# Patient Record
Sex: Female | Born: 1995 | Race: White | Hispanic: No | Marital: Single | Smoking: Current every day smoker
Health system: Southern US, Community
[De-identification: ages and names within clinical notes are randomized; demographics above are authoritative.]

## PROBLEM LIST (undated history)

## (undated) ENCOUNTER — Inpatient Hospital Stay (HOSPITAL_COMMUNITY): Payer: Self-pay

## (undated) DIAGNOSIS — J40 Bronchitis, not specified as acute or chronic: Secondary | ICD-10-CM

## (undated) DIAGNOSIS — F39 Unspecified mood [affective] disorder: Secondary | ICD-10-CM

## (undated) HISTORY — PX: TONSILLECTOMY: SUR1361

## (undated) HISTORY — PX: NASAL SINUS SURGERY: SHX719

## (undated) HISTORY — PX: WISDOM TOOTH EXTRACTION: SHX21

## (undated) HISTORY — PX: OTHER SURGICAL HISTORY: SHX169

---
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
## 2014-09-20 IMAGING — CT CT MAXILLOFACIAL W/O CM
2 series · 15 of 40 positions shown, 18 images · non-contrast
Comparison: None.

CLINICAL DATA: Fusion protocol for sinus surgery.  Chronic
sinusitis.

CT MAXILLOFACIAL WITHOUT CONTRAST
TECHNIQUE: Multidetector CT imaging of the maxillofacial
structures was performed. Multiplanar CT image reconstructions were
also generated.

[Series 400: cor · coronal · 0.31mm/px · 12 of 62 slices shown, 15 images]
[im 5/62  brain]
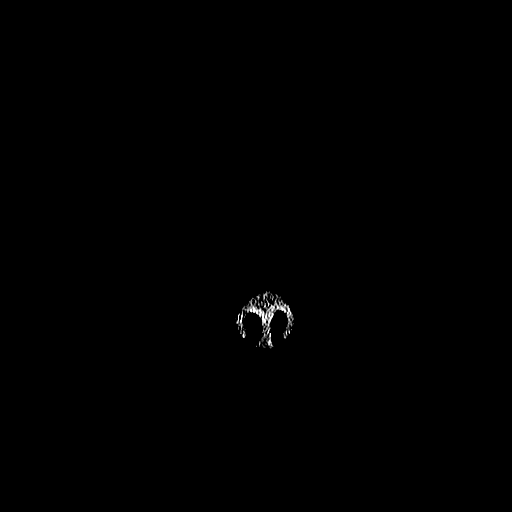
[im 5/62  bone]
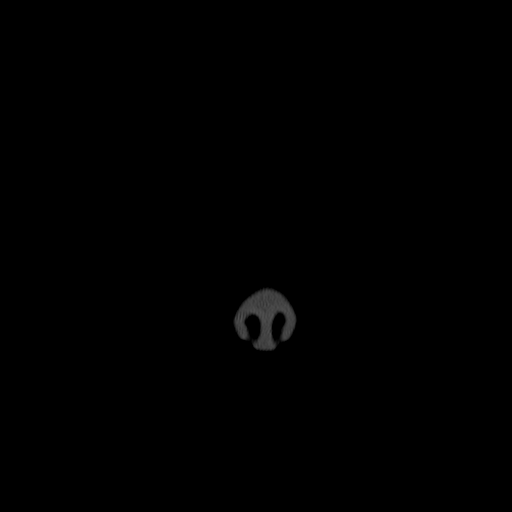
[im 9/62  bone]
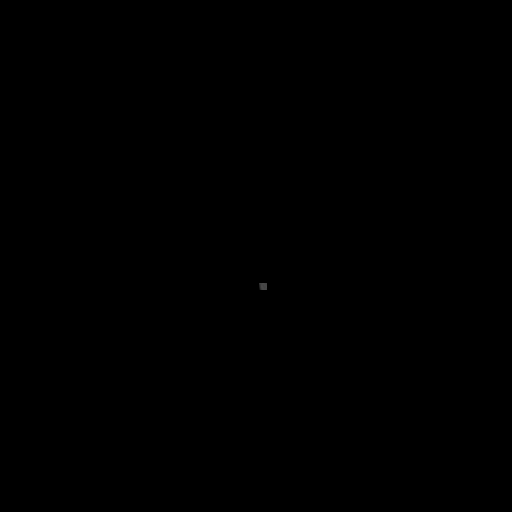
[im 14/62  bone]
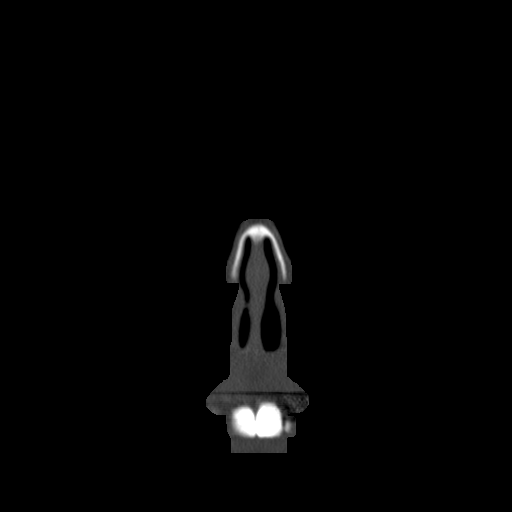
[im 21/62  bone]
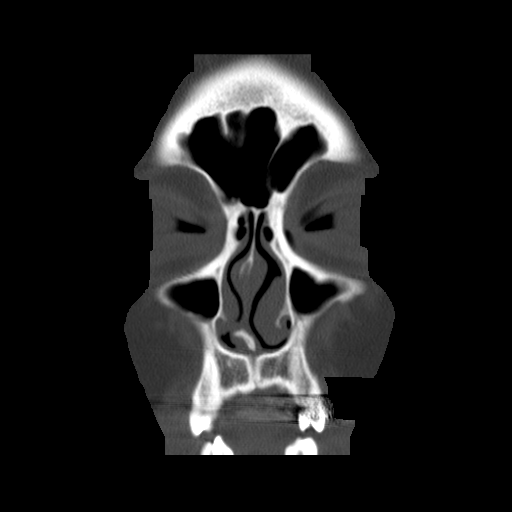
[im 27/62  brain]
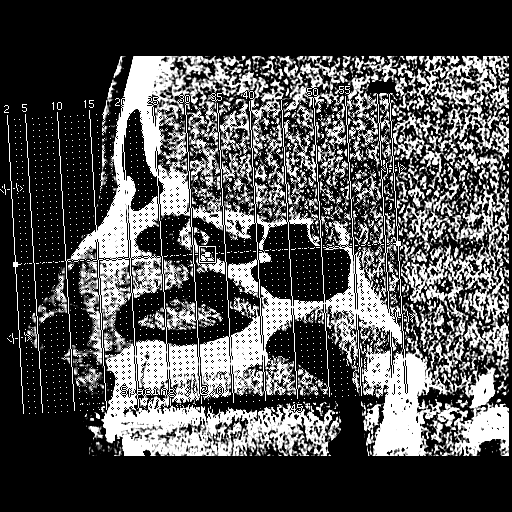
[im 27/62  bone]
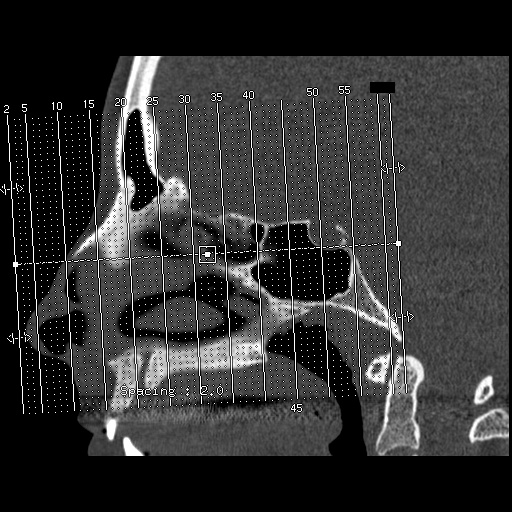
[im 28/62  bone]
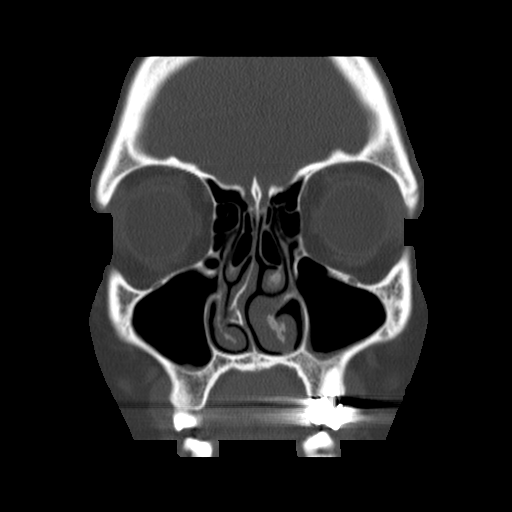
[im 34/62  bone]
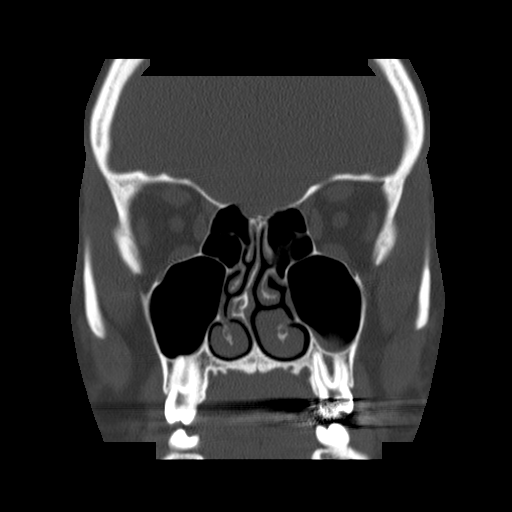
[im 35/62  bone]
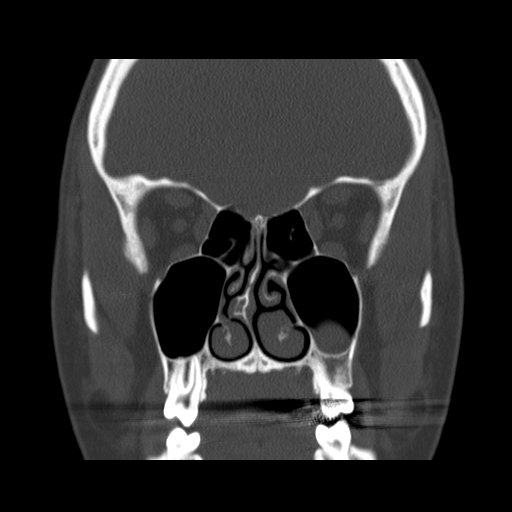
[im 41/62  brain]
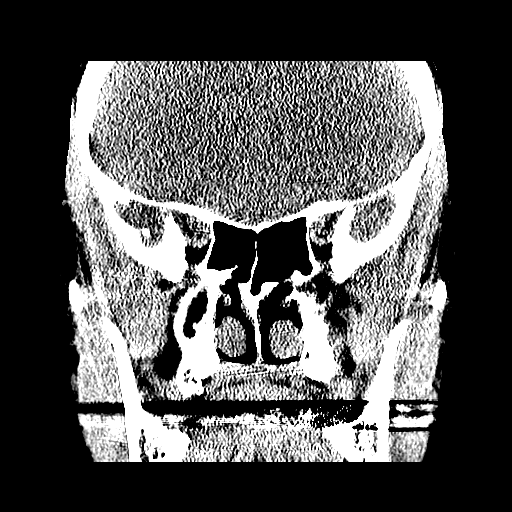
[im 41/62  bone]
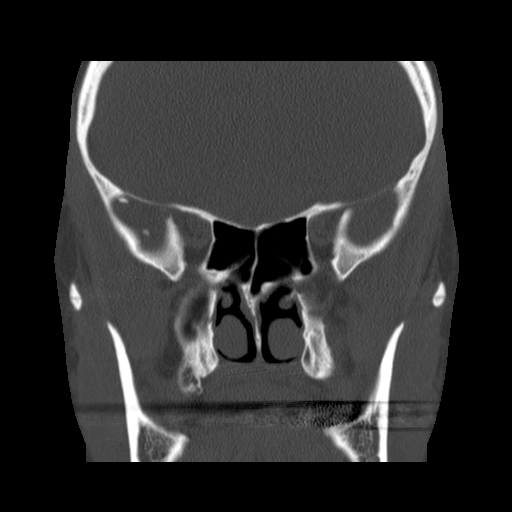
[im 48/62  bone]
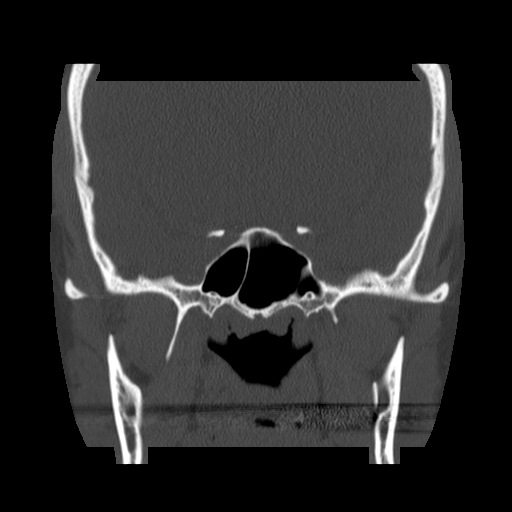
[im 53/62  bone]
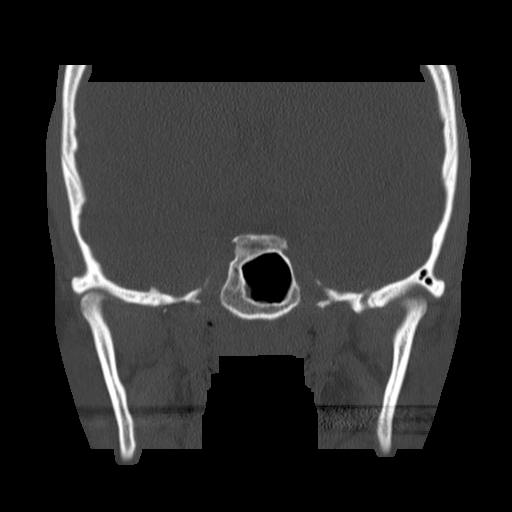
[im 57/62  bone]
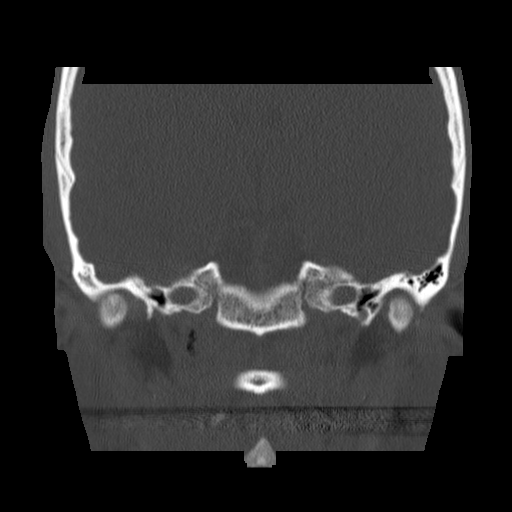

[Series 401: sag · sagittal · 0.31mm/px · 3 of 62 slices shown]
[im 27/62  bone]
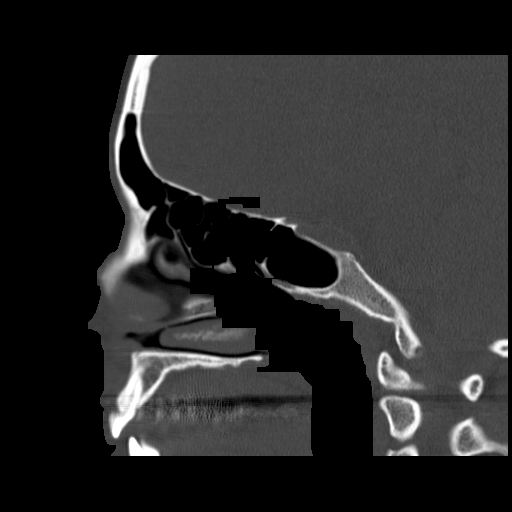
[im 31/62  bone]
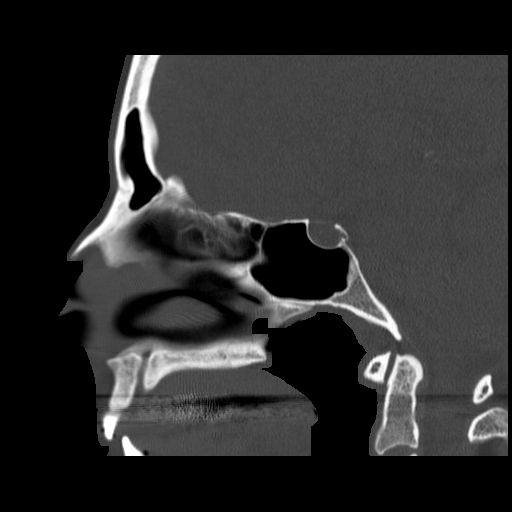
[im 35/62  bone]
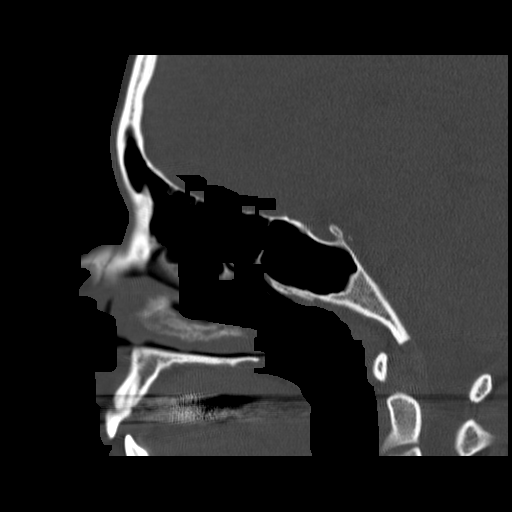

[15 of 40 positions shown; findings below may reference images not displayed]

FINDINGS: Fusion protocol was performed for operative planning.

The frontal, ethmoid, sphenoid and right maxillary sinuses are
entirely clear.  Left maxillary sinus shows mucosal thickening
along the floor and posterior wall with a 14 mm posterior wall
retention cyst.  Previous route canal evident tooth number 14

There is pronounced nasal septal deviation towards the right with a
rightward projecting spur.  Nasal passages on the right are
considerably narrowed.  Ostiomeatal complex on the left is patent,
with a single Haller cell adjacent.  Ostiomeatal complex on the
right is very thin.  Single Haller cell on this side as well.
IMPRESSION: Mucosal thickening along the floor over and posterior wall of the
left maxillary sinus with a 14 mm posterior left maxillary sinus
retention cyst.

Pronounced nasal septal deviation towards the right with a
rightward projecting septal spur.  Markedly thin ostiomeatal
complex on the right.  Single Haller cell on each side.

## 2017-04-25 ENCOUNTER — Ambulatory Visit: Payer: Medicaid Other | Admitting: Family Medicine

## 2017-12-25 ENCOUNTER — Encounter (HOSPITAL_COMMUNITY): Payer: Self-pay | Admitting: Emergency Medicine

## 2017-12-25 ENCOUNTER — Emergency Department (HOSPITAL_COMMUNITY): Payer: Medicaid Other

## 2017-12-25 ENCOUNTER — Emergency Department (HOSPITAL_COMMUNITY)
Admission: EM | Admit: 2017-12-25 | Discharge: 2017-12-25 | Disposition: A | Discharge status: Home / Self Care | Payer: Medicaid Other | Attending: Emergency Medicine | Admitting: Emergency Medicine

## 2017-12-25 DIAGNOSIS — O0289 Other abnormal products of conception: Secondary | ICD-10-CM

## 2017-12-25 DIAGNOSIS — F17210 Nicotine dependence, cigarettes, uncomplicated: Secondary | ICD-10-CM | POA: Diagnosis not present

## 2017-12-25 DIAGNOSIS — Z3A00 Weeks of gestation of pregnancy not specified: Secondary | ICD-10-CM | POA: Diagnosis not present

## 2017-12-25 DIAGNOSIS — O99330 Smoking (tobacco) complicating pregnancy, unspecified trimester: Secondary | ICD-10-CM | POA: Diagnosis not present

## 2017-12-25 DIAGNOSIS — Z79899 Other long term (current) drug therapy: Secondary | ICD-10-CM | POA: Diagnosis not present

## 2017-12-25 DIAGNOSIS — O209 Hemorrhage in early pregnancy, unspecified: Secondary | ICD-10-CM | POA: Diagnosis present

## 2017-12-25 DIAGNOSIS — N939 Abnormal uterine and vaginal bleeding, unspecified: Secondary | ICD-10-CM

## 2017-12-25 DIAGNOSIS — O3680X2 Pregnancy with inconclusive fetal viability, fetus 2: Secondary | ICD-10-CM | POA: Diagnosis not present

## 2017-12-25 LAB — BASIC METABOLIC PANEL
ANION GAP: 14 (ref 5–15)
BUN: 12 mg/dL (ref 6–20)
CALCIUM: 9.2 mg/dL (ref 8.9–10.3)
CO2: 20 mmol/L — AB (ref 22–32)
Chloride: 104 mmol/L (ref 101–111)
Creatinine, Ser: 0.57 mg/dL (ref 0.44–1.00)
GFR calc non Af Amer: >60 mL/min (ref >60)
Glucose, Bld: 91 mg/dL (ref 65–99)
Potassium: 4.5 mmol/L (ref 3.5–5.1)
Sodium: 138 mmol/L (ref 135–145)

## 2017-12-25 LAB — WET PREP, GENITAL
Sperm: NONE SEEN
Trich, Wet Prep: NONE SEEN
YEAST WET PREP: NONE SEEN

## 2017-12-25 LAB — URINALYSIS, ROUTINE W REFLEX MICROSCOPIC
Bilirubin Urine: NEGATIVE
Glucose, UA: NEGATIVE mg/dL
KETONES UR: 20 mg/dL — AB
Leukocytes, UA: NEGATIVE
Nitrite: NEGATIVE
PH: 6 (ref 5.0–8.0)
Protein, ur: NEGATIVE mg/dL
SPECIFIC GRAVITY, URINE: 1.011 (ref 1.005–1.030)

## 2017-12-25 LAB — CBC WITH DIFFERENTIAL/PLATELET
BASOS PCT: 0 %
Basophils Absolute: 0 10*3/uL (ref 0.0–0.1)
Eosinophils Absolute: 0.1 10*3/uL (ref 0.0–0.7)
Eosinophils Relative: 1 %
HEMATOCRIT: 43.2 % (ref 36.0–46.0)
HEMOGLOBIN: 14.9 g/dL (ref 12.0–15.0)
Lymphocytes Relative: 33 %
Lymphs Abs: 2.7 10*3/uL (ref 0.7–4.0)
MCH: 30.5 pg (ref 26.0–34.0)
MCHC: 34.5 g/dL (ref 30.0–36.0)
MCV: 88.5 fL (ref 78.0–100.0)
MONOS PCT: 7 %
Monocytes Absolute: 0.6 10*3/uL (ref 0.1–1.0)
NEUTROS PCT: 59 %
Neutro Abs: 4.8 10*3/uL (ref 1.7–7.7)
Platelets: 327 10*3/uL (ref 150–400)
RBC: 4.88 MIL/uL (ref 3.87–5.11)
RDW: 12.9 % (ref 11.5–15.5)
WBC: 8.2 10*3/uL (ref 4.0–10.5)

## 2017-12-25 LAB — HCG, QUANTITATIVE, PREGNANCY: HCG, BETA CHAIN, QUANT, S: 11659 m[IU]/mL — AB (ref <5)

## 2017-12-25 NOTE — ED Notes (Signed)
Patient has came back to the room. She reports she had to leave and smoke a cigarette. Notified provider that she has returned and wanting test results.

## 2017-12-25 NOTE — ED Notes (Signed)
Provider at bedside with patient.

## 2017-12-25 NOTE — ED Notes (Signed)
Patient has been updated that she is waiting on test results and provider to re-evaluate. Patient was frustrated and wanting to leave due to long emergency department visit.

## 2017-12-25 NOTE — ED Notes (Signed)
This Clinical research associatewriter called lab to check on the status of the HCG quantitative test. Was told by the lab tech that there was a problem with the machine and that the results will be available in 10 min.

## 2017-12-25 NOTE — ED Triage Notes (Signed)
Pt reports that she just found out she was pregnant and has had vaginal bleeding since last week. Reports was lighter and now gotten darker and more heavy. Reports having to change menstrual supplies about 10-15 minutes.

## 2017-12-25 NOTE — ED Notes (Signed)
Patient came to nursing station crying, asking to leave. Patient reports she wants to go outside and smoke and eat dinner. Patient reports shes tired of waiting. EDPA notified. Patient updated that she is waiting for ultrasound. Patient opted to stay and wait for her ultrasound.

## 2017-12-25 NOTE — ED Provider Notes (Signed)
[facility] DEPT Provider Note   CSN: [id number] Arrival date & time: 12/25/17  1051  History   Chief Complaint Chief Complaint  Patient presents with  . Vaginal Bleeding    HPI Shirley Escobar is a 22 y.o. female is here for evaluation of vaginal bleeding that began on 4/14.  Initial 4-5 days of bleeding was mild and bright red however over the last 3 days bleeding has become heavier and darker red looking more like a menstrual flow. Bleeding has been constant, with small clots. She is wearing panty liners and changing them every 10-15 min. She has no abdominal pain or cramping, dysuria, frequency, urgency, abnormal vaginal discharge, nausea, vomiting, changes in bowel movements. Thinks she maybe 2-3 months pregnant. Her last menstrual period was approximately 2 months ago. Her menstrual cycles tend to be irregular, frequently 5 days late. She is sexually active with 1 female partner without condom use. She does not use any other birth control methods. She has taken multiple urine pregnancy tests. Never been pregnant before.   HPI  Past Medical History:  Diagnosis Date  . Bronchitis   . Mood disorder ([place])     There are no active problems to display for this patient.   Past Surgical History:  Procedure Laterality Date  . NASAL SINUS SURGERY    . removal of birth mark    . TONSILLECTOMY    . WISDOM TOOTH EXTRACTION       OB History    Gravida  1   Para  0   Term  0   Preterm  0   AB  0   Living  0     SAB  0   TAB  0   Ectopic  0   Multiple  0   Live Births               Home Medications    Prior to Admission medications   Medication Sig Start Date End Date Taking? Authorizing Provider  Prenatal Vit-Fe Fumarate-FA (PRENATAL PO) Take 1 tablet by mouth daily.   Yes [provider]  albuterol (PROVENTIL HFA;VENTOLIN HFA) 108 (90 BASE) MCG/ACT inhaler Inhale 2 puffs into the lungs 4 (four) times daily. Patient  not taking: Reported on 12/25/2017 02/02/13   Harden Mo, MD  amoxicillin-clavulanate (AUGMENTIN) 875-125 MG per tablet Take 1 tablet by mouth 2 (two) times daily. Patient not taking: Reported on 12/25/2017 02/02/13   Harden Mo, MD  naproxen (NAPROSYN) 500 MG tablet Take 1 tablet (500 mg total) by mouth 2 (two) times daily with a meal. Patient not taking: Reported on 12/25/2017 01/30/13   Erby Pian, FNP  Norethindrone Acetate-Ethinyl Estradiol (JUNEL 1.5/30) 1.5-30 MG-MCG tablet Take 1 tablet by mouth daily. Patient not taking: Reported on 12/25/2017 01/30/13   Erby Pian, FNP  predniSONE (DELTASONE) 20 MG tablet Take 1 tablet (20 mg total) by mouth 2 (two) times daily. Patient not taking: Reported on 12/25/2017 02/02/13   Harden Mo, MD  traMADol (ULTRAM) 50 MG tablet Take 2 tablets (100 mg total) by mouth every 8 (eight) hours as needed for pain. Patient not taking: Reported on 12/25/2017 02/02/13   Harden Mo, MD    Family History Family History  Problem Relation Age of Onset  . Bipolar disorder Mother     Social History Social History   Tobacco Use  . Smoking status: Current Every Day Smoker    Packs/day: 1.00  .  Smokeless tobacco: Never Used  Substance Use Topics  . Alcohol use: No  . Drug use: No     Allergies   Patient has no known allergies.   Review of Systems Review of Systems  Genitourinary: Positive for vaginal bleeding.  All other systems reviewed and are negative.    Physical Exam Updated Vital Signs BP 129/81 (BP Location: Right Arm)   Pulse 88   Temp 98.6 F (37 C) (Oral)   Resp 12   Ht 5' (1.524 m)   Wt 54.4 kg (120 lb)   LMP 10/24/2017   SpO2 100%   BMI 23.44 kg/m   Physical Exam  Constitutional: She is oriented to person, place, and time. She appears well-developed and well-nourished. No distress.  Non toxic  HENT:  Head: Normocephalic and atraumatic.  Nose: Nose normal.  Mouth/Throat: No oropharyngeal exudate.    Moist mucous membranes   Eyes: Pupils are equal, round, and reactive to light. Conjunctivae and EOM are normal.  Neck: Normal range of motion.  Cardiovascular: Normal rate, regular rhythm and intact distal pulses.  No murmur heard. 2+ DP and radial pulses bilaterally. No LE edema.   Pulmonary/Chest: Effort normal and breath sounds normal. No respiratory distress. She has no wheezes. She has no rales.  Abdominal: Soft. Bowel sounds are normal. There is no tenderness.  No G/R/R. No suprapubic or CVA tenderness. No obvious abdominal fullness  Genitourinary: There is bleeding in the vagina.  Genitourinary Comments: External genitalia normal without erythema, edema, tenderness, discharge or lesions.  No groin lymphadenopathy.  Vaginal mucosa and cervix without discharge or lesions or obvious source of bleed. Cervical os closed. Scant amount of dark blood in vaginal vault and over cervix. No clots or tissue noted.  Uterus in midline, smooth, not enlarged or tender. No CMT. Non palpable adnexa.  Musculoskeletal: Normal range of motion. She exhibits no deformity.  Neurological: She is alert and oriented to person, place, and time.  Skin: Skin is warm and dry. Capillary refill takes less than 2 seconds.  Psychiatric: She has a normal mood and affect. Her behavior is normal. Judgment and thought content normal.  Nursing note and vitals reviewed.    ED Treatments / Results  Labs (all labs ordered are listed, but only abnormal results are displayed) Labs Reviewed  WET PREP, GENITAL - Abnormal; Notable for the following components:      Result Value   Clue Cells Wet Prep HPF POC PRESENT (*)    WBC, Wet Prep HPF POC MANY (*)    All other components within normal limits  HCG, QUANTITATIVE, PREGNANCY - Abnormal; Notable for the following components:   hCG, Beta Chain, Quant, S 11,659 (*)    All other components within normal limits  URINALYSIS, ROUTINE W REFLEX MICROSCOPIC - Abnormal; Notable for  the following components:   Hgb urine dipstick LARGE (*)    Ketones, ur 20 (*)    Bacteria, UA RARE (*)    Squamous Epithelial / LPF 6-30 (*)    All other components within normal limits  BASIC METABOLIC PANEL - Abnormal; Notable for the following components:   CO2 20 (*)    All other components within normal limits  CBC WITH DIFFERENTIAL/PLATELET  TYPE AND SCREEN  RH IG WORKUP (INCLUDES ABO/RH)  GC/CHLAMYDIA PROBE AMP ([place]) NOT AT [facility]    EKG None  Radiology US Ob Comp < 14 Wks  Result Date: 12/25/2017 CLINICAL DATA:  Vaginal bleeding x3 days.  Beta HCG  11,659 EXAM: TWIN OBSTETRIC <14WK [place] AND TRANSVAGINAL OB US COMPARISON:  None. FINDINGS: Number of IUPs:  2 Chorionicity/Amnionicity:  Dichorionic-diamniotic (thick membrane) TWIN 1 Yolk sac:  Not Visualized. Embryo:  Visualized. Cardiac Activity: Not Visualized. Heart Rate: 0 bpm CRL:  11 mm   7 w 2 d                  [place] EDC: 08/11/2018 TWIN 2 Yolk sac:  Not Visualized. Embryo:  Visualized. Cardiac Activity: Not Visualized. Heart Rate: 0 bpm CRL:  10 mm   7 w 1 d                  [place] EDC: 08/12/2018 Subchorionic hemorrhage:  None visualized. Maternal uterus/adnexae: Unremarkable adnexa. IMPRESSION: Criteria met for nonviable twin intrauterine pregnancy with crown lump length greater than 7 mm each and no heart rate detected within either gestation. This follows SRU consensus guidelines: Diagnostic Criteria for Nonviable Pregnancy Early in the First Trimester. Alison Stalling J Med [phone]. Electronically Signed   By: Ashley Royalty M.D.   On: 12/25/2017 19:45   US Ob Comp Addl Gest Less 14 Wks  Result Date: 12/25/2017 CLINICAL DATA:  Vaginal bleeding x3 days.  Beta HCG 11,659 EXAM: TWIN OBSTETRIC <14WK [place] AND TRANSVAGINAL OB US COMPARISON:  None. FINDINGS: Number of IUPs:  2 Chorionicity/Amnionicity:  Dichorionic-diamniotic (thick membrane) TWIN 1 Yolk sac:  Not Visualized. Embryo:  Visualized. Cardiac Activity: Not Visualized. Heart  Rate: 0 bpm CRL:  11 mm   7 w 2 d                  [place] EDC: 08/11/2018 TWIN 2 Yolk sac:  Not Visualized. Embryo:  Visualized. Cardiac Activity: Not Visualized. Heart Rate: 0 bpm CRL:  10 mm   7 w 1 d                  [place] EDC: 08/12/2018 Subchorionic hemorrhage:  None visualized. Maternal uterus/adnexae: Unremarkable adnexa. IMPRESSION: Criteria met for nonviable twin intrauterine pregnancy with crown lump length greater than 7 mm each and no heart rate detected within either gestation. This follows SRU consensus guidelines: Diagnostic Criteria for Nonviable Pregnancy Early in the First Trimester. Alison Stalling J Med [phone]. Electronically Signed   By: Ashley Royalty M.D.   On: 12/25/2017 19:45   US Ob Transvaginal  Result Date: 12/25/2017 CLINICAL DATA:  Vaginal bleeding x3 days.  Beta HCG 11,659 EXAM: TWIN OBSTETRIC <14WK [place] AND TRANSVAGINAL OB US COMPARISON:  None. FINDINGS: Number of IUPs:  2 Chorionicity/Amnionicity:  Dichorionic-diamniotic (thick membrane) TWIN 1 Yolk sac:  Not Visualized. Embryo:  Visualized. Cardiac Activity: Not Visualized. Heart Rate: 0 bpm CRL:  11 mm   7 w 2 d                  [place] EDC: 08/11/2018 TWIN 2 Yolk sac:  Not Visualized. Embryo:  Visualized. Cardiac Activity: Not Visualized. Heart Rate: 0 bpm CRL:  10 mm   7 w 1 d                  [place] EDC: 08/12/2018 Subchorionic hemorrhage:  None visualized. Maternal uterus/adnexae: Unremarkable adnexa. IMPRESSION: Criteria met for nonviable twin intrauterine pregnancy with crown lump length greater than 7 mm each and no heart rate detected within either gestation. This follows SRU consensus guidelines: Diagnostic Criteria for Nonviable Pregnancy Early in the First Trimester. Alison Stalling J Med [phone]. Electronically Signed   By: Shanon Brow  Randel Pigg M.D.   On: 12/25/2017 19:45    Procedures Procedures (including critical care time)  Medications Ordered in ED Medications - No data to display   Initial Impression / Assessment and Plan / ED  Course  I have reviewed the triage vital signs and the nursing notes.  Pertinent labs & imaging results that were available during my care of the patient were reviewed by me and considered in my medical decision making (see chart for details).  Clinical Course as of Dec 25 2100  Mon Dec 25, 2017  1658 HCG, Beta Neomia Dear(!): 69,409 [CG]  1658 Hgb urine dipstick(!): LARGE [CG]  1955 RN notified me patient not in room. Will attempt to locate.    [CG]    Clinical Course User Index [CG] Kinnie Feil, PA-C    22 year old here with painless vaginal bleeding 7 days. Positive urine pregnancy test at home. Differential diagnosis includes first trimester abortion, ectopic. Pending hCG Quant screening labs. We'll plan on doing pelvic exam.  Exam as above with closed cervical os, blood in vaginal vaults but not CMT, uterine or adnexal tenderness. She has been HD stable. No abdominal pain or cramping.    Labs and imaging reviewed. hcg 11, 659. Hcg WNL. UA without convincing signs of infection. Unfortunately, US shows non viable twin pregnant w/o detectable FHR.  Discussed lab and ultrasound findings with patient. I spoke to Dr. Rosana Hoes (OBGYN) who recommends close f/u in 24 hours for misoprostol vs D&C, pt states she will go to appointment.  Pending T&S/Rh prior to dc. Discussed return precautions.  Final Clinical Impressions(s) / ED Diagnoses    Final diagnoses:  Vaginal bleeding  Nonviable pregnancy    ED Discharge Orders    None       Arlean Hopping 12/25/17 2102    Margette Fast, MD 12/26/17 [phone]

## 2017-12-25 NOTE — ED Notes (Signed)
Patient had her blood drawn for tomorrow's visit at [facility]. Patient left right after blood drawn before discharge instructions were provided. Notified Claudia, PA that patient didn't receive discharge instructions. Unable to obtain discharge vital signs.

## 2017-12-25 NOTE — ED Notes (Signed)
Unable to collect labs at this time PT did not response in lobby

## 2017-12-25 NOTE — Discharge Instructions (Addendum)
Dr. Earlene Plateravis' office will contact you tomorrow to schedule an appointment within 24 hours.   If you develop worsening heavier bleeding, abdominal pain, chest pain, shortness of breath, dizziness, return to the emergency department.

## 2017-12-25 NOTE — ED Notes (Signed)
Ultrasound at patient bedside. 

## 2017-12-26 LAB — RH IG WORKUP (INCLUDES ABO/RH)
ABO/RH(D): A NEG
Gestational Age(Wks): 7

## 2017-12-26 LAB — TYPE AND SCREEN
ABO/RH(D): A NEG
ANTIBODY SCREEN: NEGATIVE

## 2017-12-26 LAB — GC/CHLAMYDIA PROBE AMP (~~LOC~~) NOT AT ARMC
CHLAMYDIA, DNA PROBE: NEGATIVE
NEISSERIA GONORRHEA: NEGATIVE

## 2018-01-01 ENCOUNTER — Other Ambulatory Visit: Payer: Self-pay

## 2018-01-01 ENCOUNTER — Inpatient Hospital Stay (HOSPITAL_COMMUNITY)
Admission: AD | Admit: 2018-01-01 | Discharge: 2018-01-01 | Discharge status: Against Medical Advice | Payer: Medicaid Other | Source: Ambulatory Visit | Attending: Obstetrics & Gynecology | Admitting: Obstetrics & Gynecology

## 2018-01-01 DIAGNOSIS — O4691 Antepartum hemorrhage, unspecified, first trimester: Secondary | ICD-10-CM | POA: Diagnosis present

## 2018-01-01 DIAGNOSIS — Z5321 Procedure and treatment not carried out due to patient leaving prior to being seen by health care provider: Secondary | ICD-10-CM | POA: Insufficient documentation

## 2018-01-01 NOTE — MAU Note (Signed)
Was seen in the ER last wks. Thought she was 3months preg.  Was told she was 5--6 wks with twins, and they were non- viable.  Is bleeding and cramping, as far as she knows she has not passed them, has a dr's appt tomorrow, but is so uncomfortable, she didn't want to wait.

## 2018-01-01 NOTE — MAU Note (Signed)
In restroom 

## 2018-01-01 NOTE — MAU Note (Signed)
Told front desk she was leaving (1503)

## 2018-01-04 ENCOUNTER — Encounter: Payer: Self-pay | Admitting: Obstetrics & Gynecology

## 2018-01-04 ENCOUNTER — Ambulatory Visit (INDEPENDENT_AMBULATORY_CARE_PROVIDER_SITE_OTHER): Payer: Medicaid Other | Admitting: Obstetrics & Gynecology

## 2018-01-04 VITALS — BP 114/71 | HR 70 | Wt 117.5 lb

## 2018-01-04 DIAGNOSIS — O26899 Other specified pregnancy related conditions, unspecified trimester: Secondary | ICD-10-CM

## 2018-01-04 DIAGNOSIS — O039 Complete or unspecified spontaneous abortion without complication: Secondary | ICD-10-CM | POA: Diagnosis present

## 2018-01-04 DIAGNOSIS — Z6791 Unspecified blood type, Rh negative: Secondary | ICD-10-CM

## 2018-01-04 MED ORDER — RHO D IMMUNE GLOBULIN 1500 UNIT/2ML IJ SOSY
300.0000 ug | PREFILLED_SYRINGE | Freq: Once | INTRAMUSCULAR | Status: AC
  Administered 2018-01-04: 300 ug via INTRAMUSCULAR

## 2018-01-04 MED ORDER — RHO D IMMUNE GLOBULIN 1500 UNIT/2ML IJ SOSY: 300.0000 ug | PREFILLED_SYRINGE | Freq: Once | INTRAMUSCULAR | Status: DC

## 2018-01-04 NOTE — Addendum Note (Signed)
Addended by: Faythe Casa on: 01/04/2018 05:32 PM   Modules accepted: Orders

## 2018-01-04 NOTE — Patient Instructions (Signed)
Return to clinic for any scheduled appointments or for any gynecologic concerns as needed.   

## 2018-01-04 NOTE — Progress Notes (Signed)
GYNECOLOGY OFFICE VISIT NOTE  History:  22 y.o. G1P0 here today for follow up after diagnosis of failed twin IUP at 7 weeks on 12/25/17.  She was supposed to follow up today to discuss management; but she reported having heavy bleeding and passed tissue and clots a few days ago   Minimal bleeding now. She is very sad (screen score 20) but denies HI/SI and declines to talk to counselor. Reports having a lot of support . She denies any abnormal vaginal discharge, pelvic pain or other concerns.   Past Medical History:  Diagnosis Date  . Bronchitis   . Mood disorder [facility])     Past Surgical History:  Procedure Laterality Date  . NASAL SINUS SURGERY    . removal of birth mark    . TONSILLECTOMY    . WISDOM TOOTH EXTRACTION      The following portions of the patient's history were reviewed and updated as appropriate: allergies, current medications, past family history, past medical history, past social history, past surgical history and problem list.   Health Maintenance:  Normal pap in 09/2017 at [facility].  Review of Systems:  Pertinent items noted in HPI and remainder of comprehensive ROS otherwise negative.  Objective:  Physical Exam BP 114/71   Pulse 70   Wt 117 lb 8 oz (53.3 kg)   LMP 10/24/2017   Breastfeeding? Unknown   BMI 22.95 kg/m  CONSTITUTIONAL: Well-developed, well-nourished female in no acute distress.  HENT:  Normocephalic, atraumatic. External right and left ear normal. Oropharynx is clear and moist EYES: Conjunctivae and EOM are normal. Pupils are equal, round, and reactive to light. No scleral icterus.  NECK: Normal range of motion, supple, no masses SKIN: Skin is warm and dry. No rash noted. Not diaphoretic. No erythema. No pallor. NEUROLOGIC: Alert and oriented to person, place, and time. Normal reflexes, muscle tone coordination. No cranial nerve deficit noted. PSYCHIATRIC: Normal mood and affect. Normal behavior. Normal judgment and thought  content. CARDIOVASCULAR: Normal heart rate noted RESPIRATORY: Effort and breath sounds normal, no problems with respiration noted ABDOMEN: Soft, no distention noted.   PELVIC: Deferred MUSCULOSKELETAL: Normal range of motion. No edema noted.  Labs and Imaging US Ob Comp < 14 Wks  Result Date: 12/25/2017 CLINICAL DATA:  Vaginal bleeding x3 days.  Beta HCG 11,659 EXAM: TWIN OBSTETRIC <14WK [place] AND TRANSVAGINAL OB US COMPARISON:  None. FINDINGS: Number of IUPs:  2 Chorionicity/Amnionicity:  Dichorionic-diamniotic (thick membrane) TWIN 1 Yolk sac:  Not Visualized. Embryo:  Visualized. Cardiac Activity: Not Visualized. Heart Rate: 0 bpm CRL:  11 mm   7 w 2 d                  [place] EDC: 08/11/2018 TWIN 2 Yolk sac:  Not Visualized. Embryo:  Visualized. Cardiac Activity: Not Visualized. Heart Rate: 0 bpm CRL:  10 mm   7 w 1 d                  [place] EDC: 08/12/2018 Subchorionic hemorrhage:  None visualized. Maternal uterus/adnexae: Unremarkable adnexa. IMPRESSION: Criteria met for nonviable twin intrauterine pregnancy with crown lump length greater than 7 mm each and no heart rate detected within either gestation. This follows SRU consensus guidelines: Diagnostic Criteria for Nonviable Pregnancy Early in the First Trimester. Alison Stalling J Med [phone]. Electronically Signed   By: Ashley Royalty M.D.   On: 12/25/2017 19:45   US Ob Comp Addl Gest Less 14 Wks  Result Date: 12/25/2017  CLINICAL DATA:  Vaginal bleeding x3 days.  Beta HCG 11,659 EXAM: TWIN OBSTETRIC <14WK [place] AND TRANSVAGINAL OB US COMPARISON:  None. FINDINGS: Number of IUPs:  2 Chorionicity/Amnionicity:  Dichorionic-diamniotic (thick membrane) TWIN 1 Yolk sac:  Not Visualized. Embryo:  Visualized. Cardiac Activity: Not Visualized. Heart Rate: 0 bpm CRL:  11 mm   7 w 2 d                  [place] EDC: 08/11/2018 TWIN 2 Yolk sac:  Not Visualized. Embryo:  Visualized. Cardiac Activity: Not Visualized. Heart Rate: 0 bpm CRL:  10 mm   7 w 1 d                  [place] EDC:  08/12/2018 Subchorionic hemorrhage:  None visualized. Maternal uterus/adnexae: Unremarkable adnexa. IMPRESSION: Criteria met for nonviable twin intrauterine pregnancy with crown lump length greater than 7 mm each and no heart rate detected within either gestation. This follows SRU consensus guidelines: Diagnostic Criteria for Nonviable Pregnancy Early in the First Trimester. Alison Stalling J Med [phone]. Electronically Signed   By: Ashley Royalty M.D.   On: 12/25/2017 19:45   US Ob Transvaginal  Result Date: 12/25/2017 CLINICAL DATA:  Vaginal bleeding x3 days.  Beta HCG 11,659 EXAM: TWIN OBSTETRIC <14WK [place] AND TRANSVAGINAL OB US COMPARISON:  None. FINDINGS: Number of IUPs:  2 Chorionicity/Amnionicity:  Dichorionic-diamniotic (thick membrane) TWIN 1 Yolk sac:  Not Visualized. Embryo:  Visualized. Cardiac Activity: Not Visualized. Heart Rate: 0 bpm CRL:  11 mm   7 w 2 d                  [place] EDC: 08/11/2018 TWIN 2 Yolk sac:  Not Visualized. Embryo:  Visualized. Cardiac Activity: Not Visualized. Heart Rate: 0 bpm CRL:  10 mm   7 w 1 d                  [place] EDC: 08/12/2018 Subchorionic hemorrhage:  None visualized. Maternal uterus/adnexae: Unremarkable adnexa. IMPRESSION: Criteria met for nonviable twin intrauterine pregnancy with crown lump length greater than 7 mm each and no heart rate detected within either gestation. This follows SRU consensus guidelines: Diagnostic Criteria for Nonviable Pregnancy Early in the First Trimester. Alison Stalling J Med [phone]. Electronically Signed   By: Ashley Royalty M.D.   On: 12/25/2017 19:45    Assessment & Plan:  1. SAB (spontaneous abortion) Likely completed SAB - Beta hCG quant (ref lab) Support given to patient Told to call for worsening depression symptoms She declines contraception as partner had a vasectomy  Routine preventative health maintenance measures emphasized. Please refer to After Visit Summary for other counseling recommendations.   No follow-ups on  file.   Total face-to-face time with patient: 10 minutes.  Over 50% of encounter was spent on counseling and coordination of care.   Verita Schneiders, MD, [place] for [employer], [place]

## 2018-01-05 LAB — BETA HCG QUANT (REF LAB): hCG Quant: 233 m[IU]/mL

## 2020-03-06 IMAGING — US US OB EACH ADDL GEST<[ID]
1 series · 14 of 28 positions shown · non-contrast
Comparison: None.

CLINICAL DATA: Vaginal bleeding x3 days.  Beta HCG 11,659

EXAM:
TWIN OBSTETRIC <14WK US AND TRANSVAGINAL OB US

[Series 1: us ob each addl gest<(id) · 0.20mm/px · 60 acquisitions, 14 frames shown]
[im 3/60]
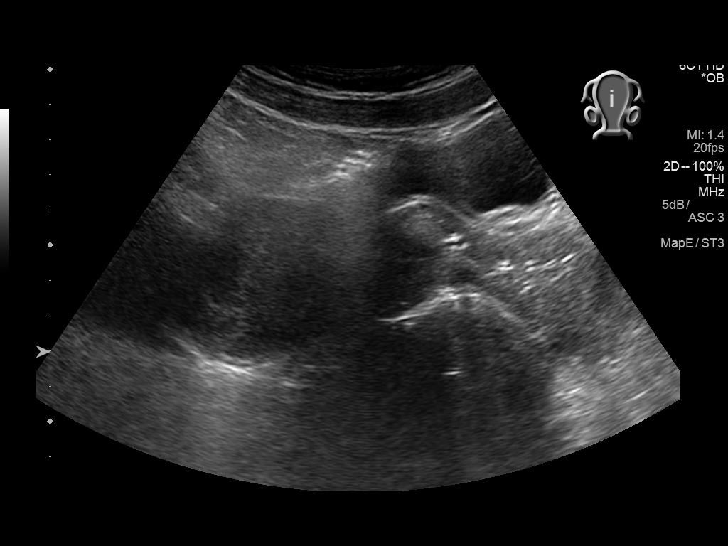
[im 7/60]
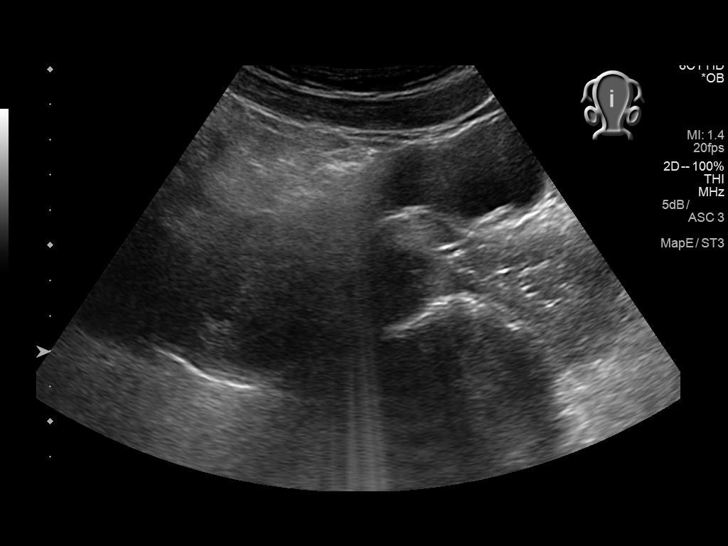
[im 11/60]
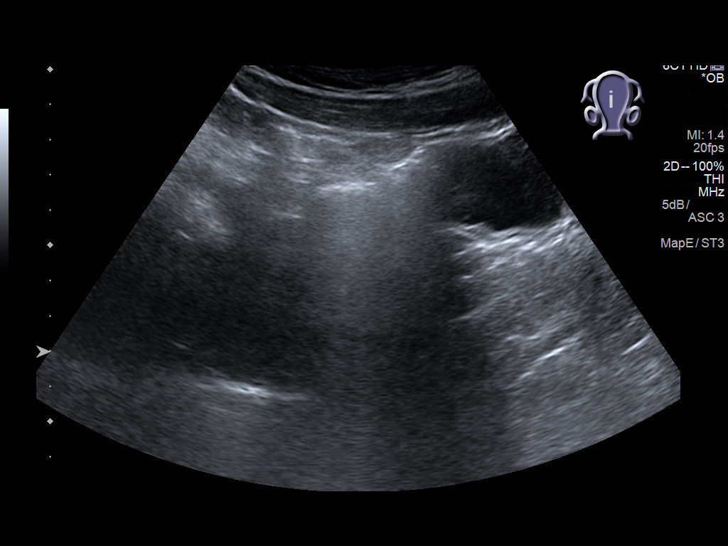
[im 16/60]
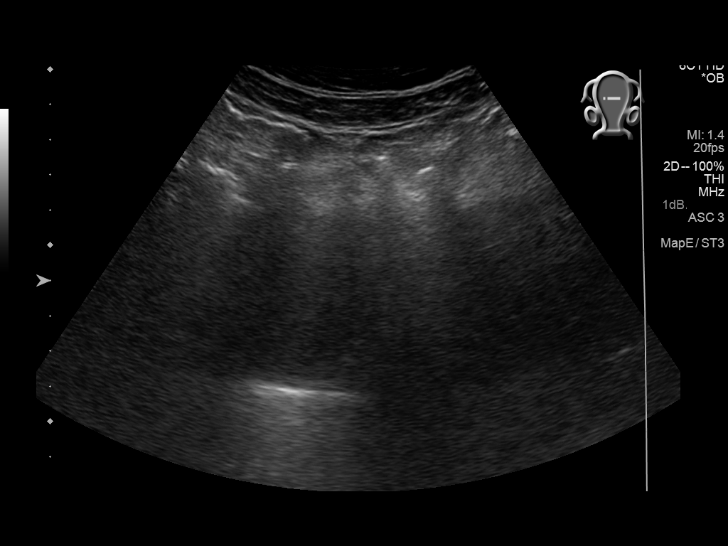
[im 20/60]
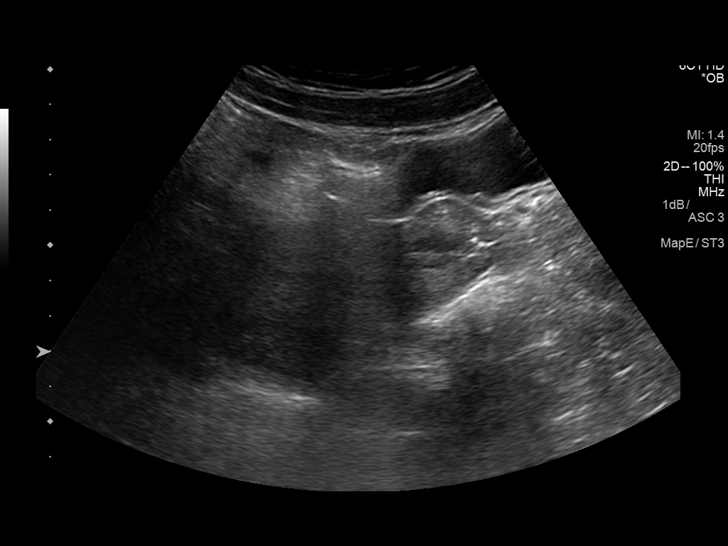
[im 25/60]
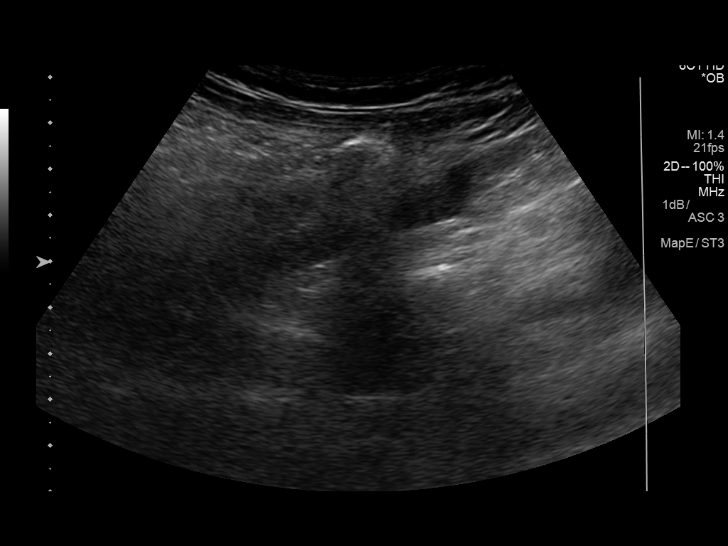
[im 29/60]
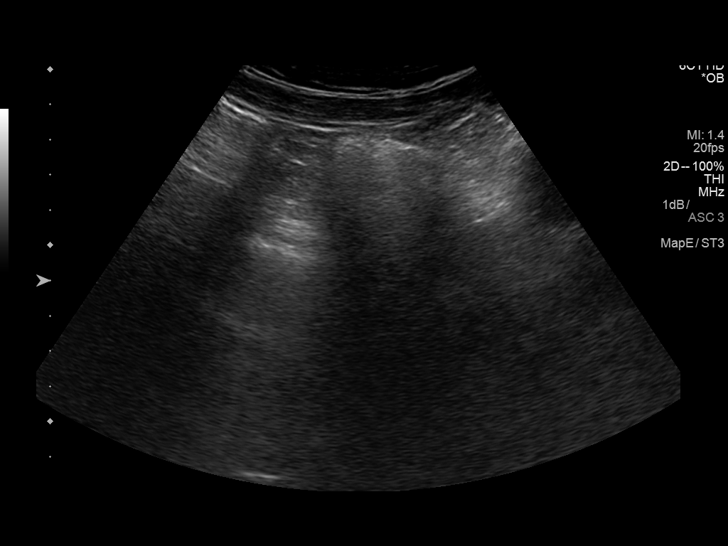
[im 33/60]
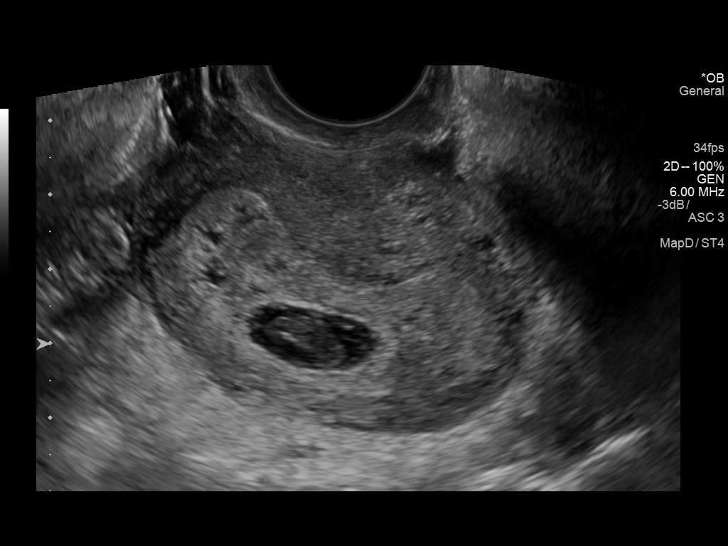
[im 38/60]
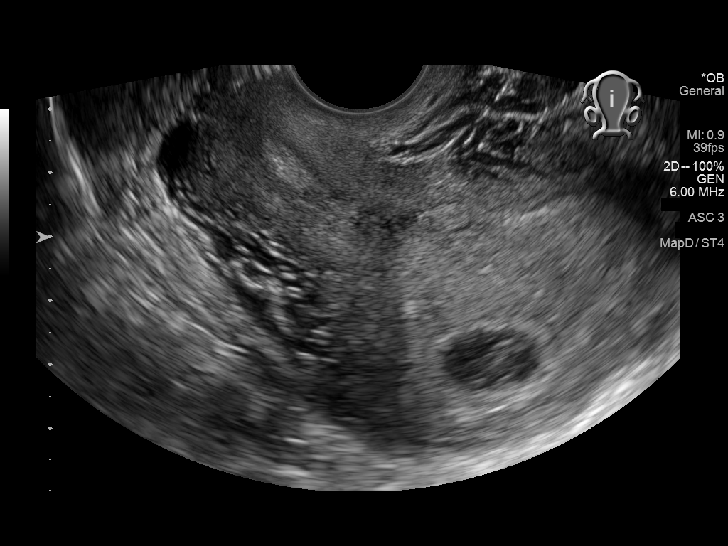
[im 42/60]
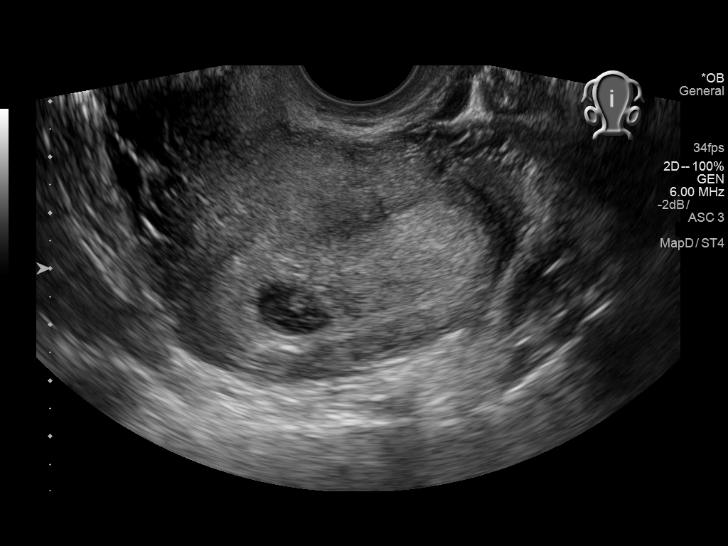
[im 46/60]
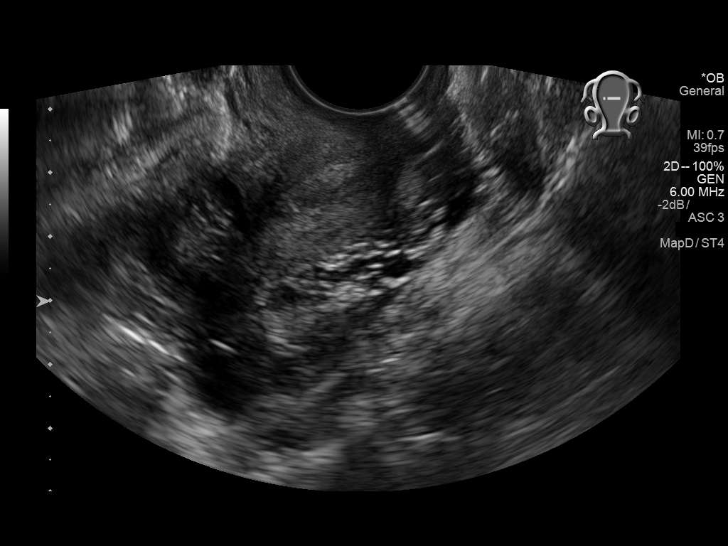
[im 51/60]
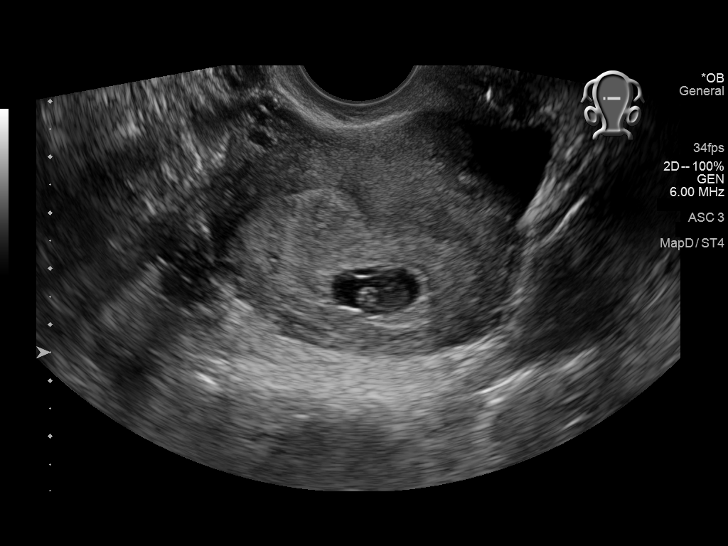
[im 55/60]
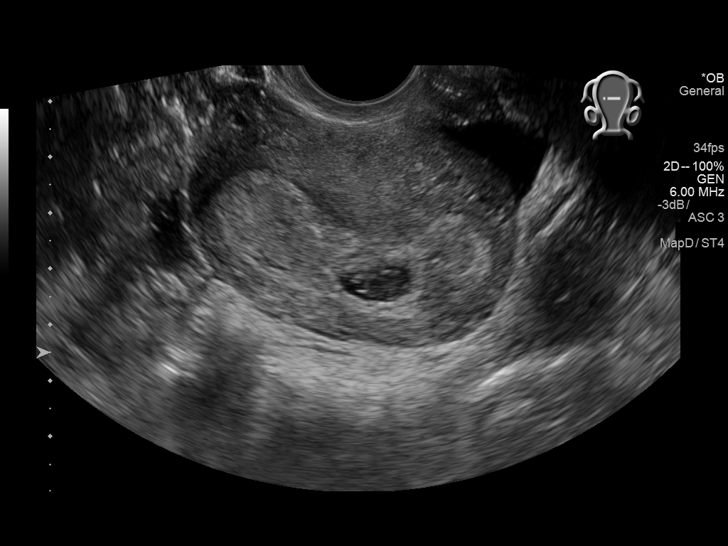
[im 60/60]
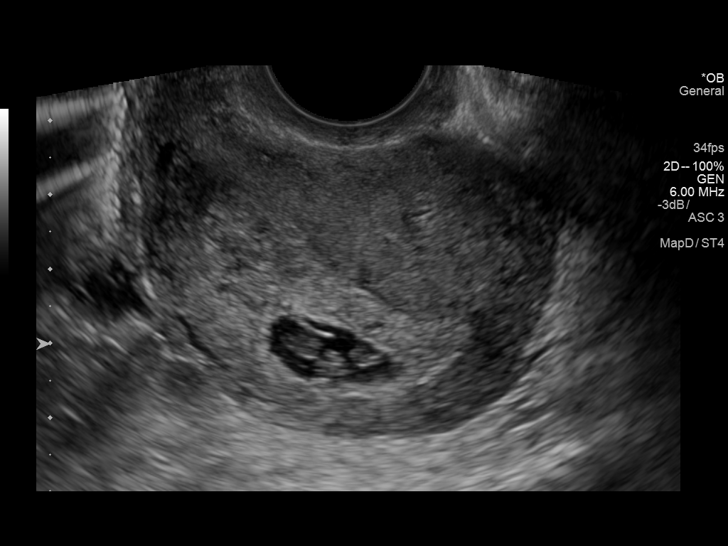

[14 of 28 positions shown; findings below may reference images not displayed]

FINDINGS: Number of IUPs:  2

Chorionicity/Amnionicity:  Dichorionic-diamniotic (thick membrane)

TWIN 1

Yolk sac:  Not Visualized.

Embryo:  Visualized.

Cardiac Activity: Not Visualized.

Heart Rate: 0 bpm

CRL:  11 mm   7 w 2 d                  US EDC: 08/11/2018

TWIN 2

Yolk sac:  Not Visualized.

Embryo:  Visualized.

Cardiac Activity: Not Visualized.

Heart Rate: 0 bpm

CRL:  10 mm   7 w 1 d                  US EDC: 08/12/2018

Subchorionic hemorrhage:  None visualized.

Maternal uterus/adnexae: Unremarkable adnexa.
IMPRESSION: Criteria met for nonviable twin intrauterine pregnancy with crown
lump length greater than 7 mm each and no heart rate detected within
either gestation. This follows SRU consensus guidelines: Diagnostic
Criteria for Nonviable Pregnancy Early in the First Trimester. N
Engl J Med [id number];[DATE].
